# Patient Record
Sex: Male | Born: 1960 | Race: White | Hispanic: No | Marital: Married | Smoking: Never smoker
Health system: Southern US, Community
[De-identification: ages and names within clinical notes are randomized; demographics above are authoritative.]

## PROBLEM LIST (undated history)

## (undated) DIAGNOSIS — I10 Essential (primary) hypertension: Secondary | ICD-10-CM

---

## 2018-12-28 ENCOUNTER — Encounter (HOSPITAL_COMMUNITY): Payer: Self-pay | Admitting: Emergency Medicine

## 2018-12-28 ENCOUNTER — Other Ambulatory Visit: Payer: Self-pay

## 2018-12-28 ENCOUNTER — Emergency Department (HOSPITAL_COMMUNITY): Payer: BLUE CROSS/BLUE SHIELD

## 2018-12-28 ENCOUNTER — Emergency Department (HOSPITAL_COMMUNITY)
Admission: EM | Admit: 2018-12-28 | Discharge: 2018-12-28 | Disposition: A | Discharge status: Home / Self Care | Payer: BLUE CROSS/BLUE SHIELD | Attending: Emergency Medicine | Admitting: Emergency Medicine

## 2018-12-28 DIAGNOSIS — Y929 Unspecified place or not applicable: Secondary | ICD-10-CM | POA: Insufficient documentation

## 2018-12-28 DIAGNOSIS — Y999 Unspecified external cause status: Secondary | ICD-10-CM | POA: Diagnosis not present

## 2018-12-28 DIAGNOSIS — W19XXXA Unspecified fall, initial encounter: Secondary | ICD-10-CM | POA: Insufficient documentation

## 2018-12-28 DIAGNOSIS — I10 Essential (primary) hypertension: Secondary | ICD-10-CM | POA: Diagnosis not present

## 2018-12-28 DIAGNOSIS — S060X1A Concussion with loss of consciousness of 30 minutes or less, initial encounter: Secondary | ICD-10-CM

## 2018-12-28 DIAGNOSIS — S161XXA Strain of muscle, fascia and tendon at neck level, initial encounter: Secondary | ICD-10-CM | POA: Diagnosis not present

## 2018-12-28 DIAGNOSIS — Z79899 Other long term (current) drug therapy: Secondary | ICD-10-CM | POA: Diagnosis not present

## 2018-12-28 DIAGNOSIS — Y939 Activity, unspecified: Secondary | ICD-10-CM | POA: Diagnosis not present

## 2018-12-28 DIAGNOSIS — S0990XA Unspecified injury of head, initial encounter: Secondary | ICD-10-CM | POA: Insufficient documentation

## 2018-12-28 DIAGNOSIS — R55 Syncope and collapse: Secondary | ICD-10-CM | POA: Diagnosis present

## 2018-12-28 HISTORY — DX: Essential (primary) hypertension: I10

## 2018-12-28 LAB — CBC WITH DIFFERENTIAL/PLATELET
Abs Immature Granulocytes: 0.01 10*3/uL (ref 0.00–0.07)
BASOS PCT: 0 %
Basophils Absolute: 0 10*3/uL (ref 0.0–0.1)
EOS ABS: 0.1 10*3/uL (ref 0.0–0.5)
Eosinophils Relative: 1 %
HCT: 44.3 % (ref 39.0–52.0)
Hemoglobin: 14.9 g/dL (ref 13.0–17.0)
Immature Granulocytes: 0 %
Lymphocytes Relative: 32 %
Lymphs Abs: 2.1 10*3/uL (ref 0.7–4.0)
MCH: 29.6 pg (ref 26.0–34.0)
MCHC: 33.6 g/dL (ref 30.0–36.0)
MCV: 87.9 fL (ref 80.0–100.0)
Monocytes Absolute: 0.5 10*3/uL (ref 0.1–1.0)
Monocytes Relative: 7 %
NEUTROS PCT: 60 %
Neutro Abs: 3.8 10*3/uL (ref 1.7–7.7)
Platelets: 206 10*3/uL (ref 150–400)
RBC: 5.04 MIL/uL (ref 4.22–5.81)
RDW: 12.2 % (ref 11.5–15.5)
WBC: 6.4 10*3/uL (ref 4.0–10.5)
nRBC: 0 % (ref 0.0–0.2)

## 2018-12-28 LAB — COMPREHENSIVE METABOLIC PANEL
ALT: 20 U/L (ref 0–44)
ANION GAP: 8 (ref 5–15)
AST: 24 U/L (ref 15–41)
Albumin: 4.2 g/dL (ref 3.5–5.0)
Alkaline Phosphatase: 40 U/L (ref 38–126)
BUN: 15 mg/dL (ref 6–20)
CO2: 25 mmol/L (ref 22–32)
Calcium: 9.2 mg/dL (ref 8.9–10.3)
Chloride: 105 mmol/L (ref 98–111)
Creatinine, Ser: 1.23 mg/dL (ref 0.61–1.24)
GFR calc Af Amer: >60 mL/min (ref >60)
GFR calc non Af Amer: >60 mL/min (ref >60)
Glucose, Bld: 99 mg/dL (ref 70–99)
Potassium: 4.4 mmol/L (ref 3.5–5.1)
Sodium: 138 mmol/L (ref 135–145)
TOTAL PROTEIN: 6.6 g/dL (ref 6.5–8.1)
Total Bilirubin: 0.6 mg/dL (ref 0.3–1.2)

## 2018-12-28 LAB — I-STAT CHEM 8, ED
BUN: 17 mg/dL (ref 6–20)
Calcium, Ion: 1.05 mmol/L — ABNORMAL LOW (ref 1.15–1.40)
Chloride: 105 mmol/L (ref 98–111)
Creatinine, Ser: 1.1 mg/dL (ref 0.61–1.24)
Glucose, Bld: 94 mg/dL (ref 70–99)
HCT: 43 % (ref 39.0–52.0)
HEMOGLOBIN: 14.6 g/dL (ref 13.0–17.0)
Potassium: 4.3 mmol/L (ref 3.5–5.1)
Sodium: 137 mmol/L (ref 135–145)
TCO2: 25 mmol/L (ref 22–32)

## 2018-12-28 MED ORDER — OXYCODONE-ACETAMINOPHEN 5-325 MG PO TABS
1.0000 | ORAL_TABLET | Freq: Once | ORAL | Status: AC
  Administered 2018-12-28: 1 via ORAL
  Filled 2018-12-28: qty 1

## 2018-12-28 MED ORDER — FENTANYL CITRATE (PF) 100 MCG/2ML IJ SOLN
50.0000 ug | Freq: Once | INTRAMUSCULAR | Status: AC
  Administered 2018-12-28: 50 ug via INTRAVENOUS
  Filled 2018-12-28: qty 2

## 2018-12-28 MED ORDER — HYDROCODONE-ACETAMINOPHEN 5-325 MG PO TABS: 1.0000 | ORAL_TABLET | ORAL | 0 refills | Status: AC | PRN

## 2018-12-28 MED ORDER — ONDANSETRON HCL 4 MG/2ML IJ SOLN
4.0000 mg | Freq: Once | INTRAMUSCULAR | Status: AC
  Administered 2018-12-28: 4 mg via INTRAVENOUS
  Filled 2018-12-28: qty 2

## 2018-12-28 MED ORDER — KETOROLAC TROMETHAMINE 30 MG/ML IJ SOLN
30.0000 mg | Freq: Once | INTRAMUSCULAR | Status: AC
  Administered 2018-12-28: 30 mg via INTRAVENOUS
  Filled 2018-12-28: qty 1

## 2018-12-28 NOTE — ED Triage Notes (Signed)
Pt here for positive LOC following 12 foot fall off latter. Pt hit back of head. No other injuries. Vitals stable per EMS

## 2018-12-28 NOTE — ED Notes (Signed)
Patient verbalizes understanding of discharge instructions. Opportunity for questioning and answers were provided. Armband removed by staff, pt discharged from ED.  

## 2018-12-28 NOTE — ED Provider Notes (Signed)
[facility] Provider Note   CSN: [id number] Arrival date & time: 12/28/18  1548     History   Chief Complaint Chief Complaint  Patient presents with  . Loss of Consciousness    HPI Brett Burns is a 58 y.o. male.  Patient is a 58 year old male who presents after a fall.  He was on a ladder and fell backward 12 feet on the ground.  He fell backward and hit his head.  He complains of pain in the back of his head and his upper neck area.  He denies any numbness or weakness to his extremities.  He did have a positive loss of consciousness.  He has had some nausea per EMS but no vomiting.  He denies any other injuries from the fall.  He is not on anticoagulants.     Past Medical History:  Diagnosis Date  . Hypertension     There are no active problems to display for this patient.   History reviewed. No pertinent surgical history.      Home Medications    Prior to Admission medications   Medication Sig Start Date End Date Taking? Authorizing Provider  Armodafinil 250 MG tablet Take 250 mg by mouth as needed (narcolepsy, sleep apnea).    Yes [provider]  lisinopril (PRINIVIL,ZESTRIL) 20 MG tablet Take 20 mg by mouth daily.   Yes [provider]  rosuvastatin (CRESTOR) 20 MG tablet Take 20 mg by mouth every evening.   Yes [provider]  sertraline (ZOLOFT) 100 MG tablet Take 100 mg by mouth daily.   Yes [provider]  HYDROcodone-acetaminophen (NORCO/VICODIN) 5-325 MG tablet Take 1-2 tablets by mouth every 4 (four) hours as needed. 12/28/18   Rolan BuccoBelfi, Pocahontas Cohenour, MD    Family History No family history on file.  Social History Social History   Tobacco Use  . Smoking status: Never Smoker  Substance Use Topics  . Alcohol use: Not Currently  . Drug use: Not Currently     Allergies   Erythromycin   Review of Systems Review of Systems  Constitutional: Negative for activity change,  appetite change and fever.  HENT: Negative for dental problem, nosebleeds and trouble swallowing.   Eyes: Negative for pain and visual disturbance.  Respiratory: Negative for shortness of breath.   Cardiovascular: Negative for chest pain.  Gastrointestinal: Positive for nausea. Negative for abdominal pain and vomiting.  Genitourinary: Negative for dysuria and hematuria.  Musculoskeletal: Positive for neck pain. Negative for arthralgias, back pain and joint swelling.  Skin: Negative for wound.  Neurological: Positive for headaches. Negative for weakness and numbness.  Psychiatric/Behavioral: Negative for confusion.     Physical Exam Updated Vital Signs BP 128/80   Pulse 64   Temp 97.8 F (36.6 C) (Oral)   Resp 12   SpO2 94%   Physical Exam Vitals signs reviewed.  Constitutional:      Appearance: He is well-developed.  HENT:     Head: Normocephalic and atraumatic.     Comments: Patient has tenderness to the posterior scalp area    Nose: Nose normal.  Eyes:     Conjunctiva/sclera: Conjunctivae normal.     Pupils: Pupils are equal, round, and reactive to light.  Neck:     Comments: Positive tenderness to the mid and upper cervical spine, no step-offs or deformities.  No pain to the thoracic, or LS spine.  No step-offs or deformities noted Cardiovascular:     Rate and Rhythm:  Normal rate and regular rhythm.     Heart sounds: No murmur.     Comments: No evidence of external trauma to the chest or abdomen Pulmonary:     Effort: Pulmonary effort is normal. No respiratory distress.     Breath sounds: Normal breath sounds. No wheezing.  Chest:     Chest wall: No tenderness.  Abdominal:     General: Bowel sounds are normal. There is no distension.     Palpations: Abdomen is soft.     Tenderness: There is no abdominal tenderness.  Musculoskeletal: Normal range of motion.     Comments: No pain on palpation or ROM of the extremities  Skin:    General: Skin is warm and dry.      Capillary Refill: Capillary refill takes less than 2 seconds.  Neurological:     Mental Status: He is alert and oriented to person, place, and time.     Sensory: No sensory deficit.     Motor: No weakness.     Comments: Motor 5 out of 5 all extremities, sensation grossly intact light touch all extremities      ED Treatments / Results  Labs (all labs ordered are listed, but only abnormal results are displayed) Labs Reviewed  I-STAT CHEM 8, ED - Abnormal; Notable for the following components:      Result Value   Calcium, Ion 1.05 (*)    All other components within normal limits  COMPREHENSIVE METABOLIC PANEL  CBC WITH DIFFERENTIAL/PLATELET    EKG EKG Interpretation  Date/Time:  Thursday December 28 2018 16:52:16 EST Ventricular Rate:  63 PR Interval:    QRS Duration: 102 QT Interval:  420 QTC Calculation: 430 R Axis:   65 Text Interpretation:  Sinus rhythm Abnormal inferior Q waves No old tracing to compare Confirmed by Rolan Bucco [phone]) on 12/28/2018 6:08:25 PM   Radiology Ct Head Wo Contrast  Result Date: 12/28/2018 CLINICAL DATA:  Patient fell from 12 feet. EXAM: CT HEAD WITHOUT CONTRAST CT CERVICAL SPINE WITHOUT CONTRAST TECHNIQUE: Multidetector CT imaging of the head and cervical spine was performed following the standard protocol without intravenous contrast. Multiplanar CT image reconstructions of the cervical spine were also generated. COMPARISON:  None. FINDINGS: CT HEAD FINDINGS Brain: No evidence of acute infarction, hemorrhage, hydrocephalus, extra-axial collection or mass lesion/mass effect. Normal cerebral volume. No white matter disease. Vascular: No hyperdense vessel or unexpected calcification. Slight prominence of the anterior cerebral arteries in the interhemispheric fissure simulates blood. Skull: Normal. Negative for fracture or focal lesion. Sinuses/Orbits: Air-fluid level in the LEFT maxillary sinus without visible fracture, may be inflammatory in origin.  No visible facial fracture. No TMJ dislocation. Negative orbits. Other: None. CT CERVICAL SPINE FINDINGS Alignment: Normal. Skull base and vertebrae: No acute fracture. No primary bone lesion or focal pathologic process. Soft tissues and spinal canal: No prevertebral fluid or swelling. No visible canal hematoma. Disc levels: Unremarkable. Previous C5-C6 fusion. Disc space narrowing C6-C7. Facet arthropathy C7-T1 and T1-T2. Upper chest: No upper rib fracture or pneumothorax. Other: None IMPRESSION: 1. No skull fracture or intracranial hemorrhage. 2. No cervical spine fracture or traumatic subluxation. 3. These results were called by telephone at the time of interpretation on 12/28/2018 at 4:10pm to Dr. Shawna Orleans Shondale Quinley , who verbally acknowledged these results. Electronically Signed   By: Elsie Stain M.D.   On: 12/28/2018 16:20   Ct Cervical Spine Wo Contrast  Result Date: 12/28/2018 CLINICAL DATA:  Patient fell from 12 feet. EXAM: CT  HEAD WITHOUT CONTRAST CT CERVICAL SPINE WITHOUT CONTRAST TECHNIQUE: Multidetector CT imaging of the head and cervical spine was performed following the standard protocol without intravenous contrast. Multiplanar CT image reconstructions of the cervical spine were also generated. COMPARISON:  None. FINDINGS: CT HEAD FINDINGS Brain: No evidence of acute infarction, hemorrhage, hydrocephalus, extra-axial collection or mass lesion/mass effect. Normal cerebral volume. No white matter disease. Vascular: No hyperdense vessel or unexpected calcification. Slight prominence of the anterior cerebral arteries in the interhemispheric fissure simulates blood. Skull: Normal. Negative for fracture or focal lesion. Sinuses/Orbits: Air-fluid level in the LEFT maxillary sinus without visible fracture, may be inflammatory in origin. No visible facial fracture. No TMJ dislocation. Negative orbits. Other: None. CT CERVICAL SPINE FINDINGS Alignment: Normal. Skull base and vertebrae: No acute fracture. No  primary bone lesion or focal pathologic process. Soft tissues and spinal canal: No prevertebral fluid or swelling. No visible canal hematoma. Disc levels: Unremarkable. Previous C5-C6 fusion. Disc space narrowing C6-C7. Facet arthropathy C7-T1 and T1-T2. Upper chest: No upper rib fracture or pneumothorax. Other: None IMPRESSION: 1. No skull fracture or intracranial hemorrhage. 2. No cervical spine fracture or traumatic subluxation. 3. These results were called by telephone at the time of interpretation on 12/28/2018 at 4:10pm to Dr. Shawna Orleans Kalil Woessner , who verbally acknowledged these results. Electronically Signed   By: Elsie Stain M.D.   On: 12/28/2018 16:20   Dg Chest Port 1 View  Result Date: 12/28/2018 CLINICAL DATA:  Larey Seat 12 feet off a ladder. Hit back of head. Positive loss of consciousness. EXAM: PORTABLE CHEST 1 VIEW COMPARISON:  None. FINDINGS: Cardiac silhouette is normal size. No mediastinal widening. No evidence of a mediastinal or hilar mass. Clear lungs. No gross pneumothorax or pleural effusion on this supine exam. Skeletal structures are intact. IMPRESSION: No active disease. Electronically Signed   By: Amie Portland M.D.   On: 12/28/2018 16:44    Procedures Procedures (including critical care time)  Medications Ordered in ED Medications  oxyCODONE-acetaminophen (PERCOCET/ROXICET) 5-325 MG per tablet 1 tablet (has no administration in time range)  ketorolac (TORADOL) 30 MG/ML injection 30 mg (has no administration in time range)  fentaNYL (SUBLIMAZE) injection 50 mcg (50 mcg Intravenous Given 12/28/18 1701)  ondansetron (ZOFRAN) injection 4 mg (4 mg Intravenous Given 12/28/18 1701)     Initial Impression / Assessment and Plan / ED Course  I have reviewed the triage vital signs and the nursing notes.  Pertinent labs & imaging results that were available during my care of the patient were reviewed by me and considered in my medical decision making (see chart for details).     Patient  is a 58 year old male who presents after a fall.  He had a positive loss of consciousness.  He complains of a headache and neck pain.  No other injuries are identified.  He had a CT scan of his head and cervical spine which showed no acute injuries.  He has no neurologic deficits.  He is feeling better after treatment in the ED although he still has a headache.  He has had no vomiting.  He is able to ambulate without ataxia.  He was discharged home in good condition.  He was given a prescription for a few hydrocodone tablets.  He was advised that he can also use ibuprofen for symptomatic relief.  He was advised to have close follow-up with his PCP.  Return precautions were given.  Final Clinical Impressions(s) / ED Diagnoses   Final diagnoses:  Concussion  with loss of consciousness of 30 minutes or less, initial encounter  Strain of neck muscle, initial encounter    ED Discharge Orders         Ordered    HYDROcodone-acetaminophen (NORCO/VICODIN) 5-325 MG tablet  Every 4 hours PRN     12/28/18 1909           Rolan BuccoBelfi, Kristol Almanzar, MD 12/28/18 1911

## 2019-05-18 ENCOUNTER — Other Ambulatory Visit: Payer: Self-pay

## 2019-05-18 ENCOUNTER — Ambulatory Visit (HOSPITAL_COMMUNITY)
Admission: EM | Admit: 2019-05-18 | Discharge: 2019-05-19 | Disposition: A | Discharge status: Home / Self Care | Payer: BLUE CROSS/BLUE SHIELD | Attending: General Surgery | Admitting: General Surgery

## 2019-05-18 ENCOUNTER — Emergency Department (HOSPITAL_COMMUNITY): Payer: BLUE CROSS/BLUE SHIELD | Admitting: Anesthesiology

## 2019-05-18 ENCOUNTER — Encounter (HOSPITAL_COMMUNITY): Payer: Self-pay | Admitting: Emergency Medicine

## 2019-05-18 ENCOUNTER — Emergency Department (HOSPITAL_COMMUNITY): Payer: BLUE CROSS/BLUE SHIELD

## 2019-05-18 ENCOUNTER — Encounter (HOSPITAL_COMMUNITY)
Admission: EM | Disposition: A | Discharge status: Home / Self Care | Payer: Self-pay | Source: Home / Self Care | Attending: Emergency Medicine

## 2019-05-18 DIAGNOSIS — G4730 Sleep apnea, unspecified: Secondary | ICD-10-CM | POA: Diagnosis not present

## 2019-05-18 DIAGNOSIS — Z79899 Other long term (current) drug therapy: Secondary | ICD-10-CM | POA: Insufficient documentation

## 2019-05-18 DIAGNOSIS — W312XXA Contact with powered woodworking and forming machines, initial encounter: Secondary | ICD-10-CM | POA: Diagnosis not present

## 2019-05-18 DIAGNOSIS — S62633B Displaced fracture of distal phalanx of left middle finger, initial encounter for open fracture: Secondary | ICD-10-CM | POA: Insufficient documentation

## 2019-05-18 DIAGNOSIS — Z881 Allergy status to other antibiotic agents status: Secondary | ICD-10-CM | POA: Diagnosis not present

## 2019-05-18 DIAGNOSIS — S66323A Laceration of extensor muscle, fascia and tendon of left middle finger at wrist and hand level, initial encounter: Secondary | ICD-10-CM | POA: Diagnosis not present

## 2019-05-18 DIAGNOSIS — Z1159 Encounter for screening for other viral diseases: Secondary | ICD-10-CM | POA: Diagnosis not present

## 2019-05-18 DIAGNOSIS — I10 Essential (primary) hypertension: Secondary | ICD-10-CM | POA: Insufficient documentation

## 2019-05-18 DIAGNOSIS — S62635B Displaced fracture of distal phalanx of left ring finger, initial encounter for open fracture: Secondary | ICD-10-CM | POA: Insufficient documentation

## 2019-05-18 DIAGNOSIS — S63293A Dislocation of distal interphalangeal joint of left middle finger, initial encounter: Secondary | ICD-10-CM | POA: Diagnosis not present

## 2019-05-18 HISTORY — PX: I & D EXTREMITY: SHX5045

## 2019-05-18 LAB — SARS CORONAVIRUS 2 BY RT PCR (HOSPITAL ORDER, PERFORMED IN ~~LOC~~ HOSPITAL LAB): SARS Coronavirus 2: NEGATIVE

## 2019-05-18 SURGERY — IRRIGATION AND DEBRIDEMENT EXTREMITY
Anesthesia: General | Laterality: Left

## 2019-05-18 MED ORDER — ROCURONIUM BROMIDE 10 MG/ML (PF) SYRINGE
PREFILLED_SYRINGE | INTRAVENOUS | Status: AC
  Filled 2019-05-18: qty 10

## 2019-05-18 MED ORDER — OXYCODONE HCL 5 MG/5ML PO SOLN: 5.0000 mg | Freq: Once | ORAL | Status: AC | PRN

## 2019-05-18 MED ORDER — MIDAZOLAM HCL 5 MG/5ML IJ SOLN
INTRAMUSCULAR | Status: DC | PRN
  Administered 2019-05-18: 2 mg via INTRAVENOUS

## 2019-05-18 MED ORDER — GLYCOPYRROLATE 0.2 MG/ML IJ SOLN
INTRAMUSCULAR | Status: DC | PRN
  Administered 2019-05-18: .2 mg via INTRAVENOUS

## 2019-05-18 MED ORDER — FENTANYL CITRATE (PF) 250 MCG/5ML IJ SOLN
INTRAMUSCULAR | Status: AC
  Filled 2019-05-18: qty 5

## 2019-05-18 MED ORDER — OXYCODONE HCL 5 MG PO TABS
5.0000 mg | ORAL_TABLET | Freq: Once | ORAL | Status: AC | PRN
  Administered 2019-05-18: 5 mg via ORAL

## 2019-05-18 MED ORDER — SUCCINYLCHOLINE CHLORIDE 200 MG/10ML IV SOSY
PREFILLED_SYRINGE | INTRAVENOUS | Status: AC
  Filled 2019-05-18: qty 20

## 2019-05-18 MED ORDER — FENTANYL CITRATE (PF) 250 MCG/5ML IJ SOLN
INTRAMUSCULAR | Status: DC | PRN
  Administered 2019-05-18: 50 ug via INTRAVENOUS
  Administered 2019-05-18: 100 ug via INTRAVENOUS
  Administered 2019-05-18 (×2): 50 ug via INTRAVENOUS

## 2019-05-18 MED ORDER — EPHEDRINE SULFATE 50 MG/ML IJ SOLN
INTRAMUSCULAR | Status: DC | PRN
  Administered 2019-05-18 (×2): 5 mg via INTRAVENOUS

## 2019-05-18 MED ORDER — OXYCODONE HCL 5 MG PO TABS
ORAL_TABLET | ORAL | Status: AC
  Filled 2019-05-18: qty 1

## 2019-05-18 MED ORDER — SUCCINYLCHOLINE CHLORIDE 200 MG/10ML IV SOSY
PREFILLED_SYRINGE | INTRAVENOUS | Status: DC | PRN
  Administered 2019-05-18: 80 mg via INTRAVENOUS

## 2019-05-18 MED ORDER — HYDROMORPHONE HCL 1 MG/ML IJ SOLN
1.0000 mg | Freq: Once | INTRAMUSCULAR | Status: AC
  Administered 2019-05-18: 1 mg via INTRAVENOUS
  Filled 2019-05-18: qty 1

## 2019-05-18 MED ORDER — LACTATED RINGERS IV SOLN
INTRAVENOUS | Status: DC | PRN
  Administered 2019-05-18: 22:00:00 via INTRAVENOUS

## 2019-05-18 MED ORDER — BUPIVACAINE HCL (PF) 0.25 % IJ SOLN
INTRAMUSCULAR | Status: AC
  Filled 2019-05-18: qty 30

## 2019-05-18 MED ORDER — PROPOFOL 10 MG/ML IV BOLUS
INTRAVENOUS | Status: AC
  Filled 2019-05-18: qty 40

## 2019-05-18 MED ORDER — 0.9 % SODIUM CHLORIDE (POUR BTL) OPTIME
TOPICAL | Status: DC | PRN
  Administered 2019-05-18: 1000 mL

## 2019-05-18 MED ORDER — FENTANYL CITRATE (PF) 100 MCG/2ML IJ SOLN
INTRAMUSCULAR | Status: AC
  Filled 2019-05-18: qty 2

## 2019-05-18 MED ORDER — ACETAMINOPHEN 10 MG/ML IV SOLN: 1000.0000 mg | Freq: Once | INTRAVENOUS | Status: DC | PRN

## 2019-05-18 MED ORDER — ACETAMINOPHEN 160 MG/5ML PO SOLN: 1000.0000 mg | Freq: Once | ORAL | Status: DC | PRN

## 2019-05-18 MED ORDER — CEFAZOLIN SODIUM-DEXTROSE 2-3 GM-%(50ML) IV SOLR
INTRAVENOUS | Status: DC | PRN
  Administered 2019-05-18: 2 g via INTRAVENOUS

## 2019-05-18 MED ORDER — ACETAMINOPHEN 500 MG PO TABS: 1000.0000 mg | ORAL_TABLET | Freq: Once | ORAL | Status: DC | PRN

## 2019-05-18 MED ORDER — BUPIVACAINE HCL (PF) 0.25 % IJ SOLN
INTRAMUSCULAR | Status: DC | PRN
  Administered 2019-05-18: 10 mL

## 2019-05-18 MED ORDER — EPHEDRINE 5 MG/ML INJ
INTRAVENOUS | Status: AC
  Filled 2019-05-18: qty 30

## 2019-05-18 MED ORDER — CEPHALEXIN 500 MG PO CAPS: 500.0000 mg | ORAL_CAPSULE | Freq: Four times a day (QID) | ORAL | 0 refills | Status: AC

## 2019-05-18 MED ORDER — GLYCOPYRROLATE PF 0.2 MG/ML IJ SOSY
PREFILLED_SYRINGE | INTRAMUSCULAR | Status: AC
  Filled 2019-05-18: qty 1

## 2019-05-18 MED ORDER — MIDAZOLAM HCL 2 MG/2ML IJ SOLN
INTRAMUSCULAR | Status: AC
  Filled 2019-05-18: qty 2

## 2019-05-18 MED ORDER — ONDANSETRON HCL 4 MG/2ML IJ SOLN
INTRAMUSCULAR | Status: AC
  Filled 2019-05-18: qty 2

## 2019-05-18 MED ORDER — HYDROCODONE-ACETAMINOPHEN 5-325 MG PO TABS: 1.0000 | ORAL_TABLET | ORAL | Status: DC | PRN

## 2019-05-18 MED ORDER — PROPOFOL 10 MG/ML IV BOLUS
INTRAVENOUS | Status: DC | PRN
  Administered 2019-05-18: 180 mg via INTRAVENOUS

## 2019-05-18 MED ORDER — FENTANYL CITRATE (PF) 100 MCG/2ML IJ SOLN
25.0000 ug | INTRAMUSCULAR | Status: DC | PRN
  Administered 2019-05-18: 25 ug via INTRAVENOUS
  Administered 2019-05-18: 50 ug via INTRAVENOUS

## 2019-05-18 MED ORDER — ONDANSETRON HCL 4 MG/2ML IJ SOLN
INTRAMUSCULAR | Status: DC | PRN
  Administered 2019-05-18: 4 mg via INTRAVENOUS

## 2019-05-18 MED ORDER — MORPHINE SULFATE (PF) 4 MG/ML IV SOLN
4.0000 mg | Freq: Once | INTRAVENOUS | Status: AC
  Administered 2019-05-18: 4 mg via INTRAVENOUS
  Filled 2019-05-18: qty 1

## 2019-05-18 SURGICAL SUPPLY — 45 items
BAG DECANTER FOR FLEXI CONT (MISCELLANEOUS) IMPLANT
BANDAGE ACE 3X5.8 VEL STRL LF (GAUZE/BANDAGES/DRESSINGS) IMPLANT
BANDAGE ACE 4X5 VEL STRL LF (GAUZE/BANDAGES/DRESSINGS) IMPLANT
BNDG COHESIVE 1X5 TAN STRL LF (GAUZE/BANDAGES/DRESSINGS) ×3 IMPLANT
BNDG CONFORM 2 STRL LF (GAUZE/BANDAGES/DRESSINGS) ×3 IMPLANT
BNDG GAUZE ELAST 4 BULKY (GAUZE/BANDAGES/DRESSINGS) IMPLANT
CORDS BIPOLAR (ELECTRODE) IMPLANT
COVER WAND RF STERILE (DRAPES) IMPLANT
CUFF TOURNIQUET SINGLE 18IN (TOURNIQUET CUFF) IMPLANT
DRAPE SURG 17X23 STRL (DRAPES) ×3 IMPLANT
DRSG XEROFORM 1X8 (GAUZE/BANDAGES/DRESSINGS) IMPLANT
ELECT REM PT RETURN 9FT ADLT (ELECTROSURGICAL)
ELECTRODE REM PT RTRN 9FT ADLT (ELECTROSURGICAL) IMPLANT
FLUID NSS /IRRIG 3000 ML XXX (IV SOLUTION) IMPLANT
GAUZE PACKING IODOFORM 1/4X5 (PACKING) IMPLANT
GAUZE SPONGE 4X4 12PLY STRL (GAUZE/BANDAGES/DRESSINGS) ×3 IMPLANT
GAUZE XEROFORM 1X8 LF (GAUZE/BANDAGES/DRESSINGS) ×3 IMPLANT
GLOVE BIOGEL M 8.0 STRL (GLOVE) ×3 IMPLANT
GOWN STRL REUS W/ TWL LRG LVL3 (GOWN DISPOSABLE) ×2 IMPLANT
GOWN STRL REUS W/TWL LRG LVL3 (GOWN DISPOSABLE) ×4
HANDPIECE INTERPULSE COAX TIP (DISPOSABLE)
KIT BASIN OR (CUSTOM PROCEDURE TRAY) ×3 IMPLANT
KIT TURNOVER KIT B (KITS) ×3 IMPLANT
MANIFOLD NEPTUNE II (INSTRUMENTS) IMPLANT
NEEDLE HYPO 25GX1X1/2 BEV (NEEDLE) IMPLANT
NS IRRIG 1000ML POUR BTL (IV SOLUTION) ×3 IMPLANT
PACK ORTHO EXTREMITY (CUSTOM PROCEDURE TRAY) ×3 IMPLANT
PAD ARMBOARD 7.5X6 YLW CONV (MISCELLANEOUS) IMPLANT
PAD CAST 4YDX4 CTTN HI CHSV (CAST SUPPLIES) IMPLANT
PADDING CAST COTTON 4X4 STRL (CAST SUPPLIES)
SET CYSTO W/LG BORE CLAMP LF (SET/KITS/TRAYS/PACK) IMPLANT
SET HNDPC FAN SPRY TIP SCT (DISPOSABLE) IMPLANT
SOAP 2 % CHG 4 OZ (WOUND CARE) ×3 IMPLANT
SPONGE LAP 18X18 RF (DISPOSABLE) IMPLANT
SPONGE LAP 4X18 RFD (DISPOSABLE) IMPLANT
SUT CHROMIC 6 0 PS 4 (SUTURE) ×3 IMPLANT
SUT ETHILON 5 0 PS 2 18 (SUTURE) ×3 IMPLANT
SWAB CULTURE ESWAB REG 1ML (MISCELLANEOUS) IMPLANT
SYR CONTROL 10ML LL (SYRINGE) IMPLANT
TOWEL OR 17X24 6PK STRL BLUE (TOWEL DISPOSABLE) IMPLANT
TOWEL OR 17X26 10 PK STRL BLUE (TOWEL DISPOSABLE) ×3 IMPLANT
TUBE CONNECTING 12'X1/4 (SUCTIONS) ×1
TUBE CONNECTING 12X1/4 (SUCTIONS) ×2 IMPLANT
WATER STERILE IRR 1000ML POUR (IV SOLUTION) ×3 IMPLANT
YANKAUER SUCT BULB TIP NO VENT (SUCTIONS) ×3 IMPLANT

## 2019-05-18 NOTE — Anesthesia Preprocedure Evaluation (Addendum)
Anesthesia Evaluation  Patient identified by MRN, date of birth, ID band Patient awake    Reviewed: Allergy & Precautions, NPO status , Patient's Chart, lab work & pertinent test results  History of Anesthesia Complications Negative for: history of anesthetic complications  Airway Mallampati: II  TM Distance: >3 FB Neck ROM: Full    Dental  (+) Teeth Intact   Pulmonary sleep apnea ,  S/p hypoglossal nerve stim   breath sounds clear to auscultation       Cardiovascular hypertension, Pt. on medications  Rhythm:Regular     Neuro/Psych negative neurological ROS  negative psych ROS   GI/Hepatic negative GI ROS, Neg liver ROS,   Endo/Other  negative endocrine ROS  Renal/GU negative Renal ROS     Musculoskeletal COMPLEX LACERATION  3RD AND 4TH DIGIT   Abdominal   Peds  Hematology negative hematology ROS (+)   Anesthesia Other Findings   Reproductive/Obstetrics                            Anesthesia Physical Anesthesia Plan  ASA: II  Anesthesia Plan: General   Post-op Pain Management:    Induction: Intravenous  PONV Risk Score and Plan: 2 and Ondansetron and Dexamethasone  Airway Management Planned: LMA and Oral ETT  Additional Equipment: None  Intra-op Plan:   Post-operative Plan: Extubation in OR  Informed Consent: I have reviewed the patients History and Physical, chart, labs and discussed the procedure including the risks, benefits and alternatives for the proposed anesthesia with the patient or authorized representative who has indicated his/her understanding and acceptance.     Dental advisory given  Plan Discussed with: CRNA and Surgeon  Anesthesia Plan Comments:         Anesthesia Quick Evaluation

## 2019-05-18 NOTE — Consult Note (Signed)
Reason for Consult:finger injury Referring Physician: ER  CC:I cut my fingers  HPI:  Brett Burns is an 58 y.o.  male who presents with  Complex lacerations, open fractures of left 3,4 digits.  Pt was using a table saw this afternoon and his fingers slipped into the blade .   Pain is rated at   9 /10 and is described as sharp.  Pain is constant.  Pain is made better by rest/immobilization, worse with motion.   Associated signs/symptoms: no other injuries Previous treatment:    Past Medical History:  Diagnosis Date  . Hypertension     History reviewed. No pertinent surgical history.  No family history on file.  Social History:  reports that he has never smoked. He does not have any smokeless tobacco history on file. He reports previous alcohol use. He reports previous drug use.  Allergies:  Allergies  Allergen Reactions  . Erythromycin Other (See Comments)    Itching    Medications: I have reviewed the patient's current medications.  Results for orders placed or performed during the hospital encounter of 05/18/19 (from the past 48 hour(s))  SARS Coronavirus 2 (CEPHEID - Performed in [facility] hospital lab), Hosp Order     Status: None   Collection Time: 05/18/19  7:32 PM  Result Value Ref Range   SARS Coronavirus 2 NEGATIVE NEGATIVE    Comment: (NOTE) If result is NEGATIVE SARS-CoV-2 target nucleic acids are NOT DETECTED. The SARS-CoV-2 RNA is generally detectable in upper and lower  respiratory specimens during the acute phase of infection. The lowest  concentration of SARS-CoV-2 viral copies this assay can detect is 250  copies / mL. A negative result does not preclude SARS-CoV-2 infection  and should not be used as the sole basis for treatment or other  patient management decisions.  A negative result may occur with  improper specimen collection / handling, submission of specimen other  than nasopharyngeal swab, presence of viral mutation(s) within the  areas  targeted by this assay, and inadequate number of viral copies  (<250 copies / mL). A negative result must be combined with clinical  observations, patient history, and epidemiological information. If result is POSITIVE SARS-CoV-2 target nucleic acids are DETECTED. The SARS-CoV-2 RNA is generally detectable in upper and lower  respiratory specimens dur ing the acute phase of infection.  Positive  results are indicative of active infection with SARS-CoV-2.  Clinical  correlation with patient history and other diagnostic information is  necessary to determine patient infection status.  Positive results do  not rule out bacterial infection or co-infection with other viruses. If result is PRESUMPTIVE POSTIVE SARS-CoV-2 nucleic acids MAY BE PRESENT.   A presumptive positive result was obtained on the submitted specimen  and confirmed on repeat testing.  While 2019 novel coronavirus  (SARS-CoV-2) nucleic acids may be present in the submitted sample  additional confirmatory testing may be necessary for epidemiological  and / or clinical management purposes  to differentiate between  SARS-CoV-2 and other Sarbecovirus currently known to infect humans.  If clinically indicated additional testing with an alternate test  methodology [phone]([id number]) is advised. The SARS-CoV-2 RNA is generally  detectable in upper and lower respiratory sp ecimens during the acute  phase of infection. The expected result is Negative. Fact Sheet for Patients:  BoilerBrush.com.cyhttps://www.fda.gov/media/136312/download Fact Sheet for Healthcare Providers: https://pope.com/https://www.fda.gov/media/136313/download This test is not yet approved or cleared by the [place] FDA and has been authorized for detection and/or diagnosis of SARS-CoV-2 by  FDA under an Emergency Use Authorization (EUA).  This EUA will remain in effect (meaning this test can be used) for the duration of the COVID-19 declaration under Section 564(b)(1) of the Act, 21 U.S.C. section  360bbb-3(b)(1), unless the authorization is terminated or revoked sooner. Performed at [facility], [address] [place], [place] [address]     Dg Hand Complete Left  Result Date: 05/18/2019 CLINICAL DATA:  Injury with table saw EXAM: LEFT HAND - COMPLETE 3+ VIEW COMPARISON:  None. FINDINGS: Frontal, oblique, and lateral views were obtained. There is soft tissue injury involving the distal third and fourth digits. There is a comminuted fracture of the distal aspect of the fourth distal phalanx with several displaced fracture fragments. There is dislocation at the third DIP joint with volar displacement of the distal third phalanx with respect to the third middle phalanx. There is a fracture along the dorsal proximal aspect of the third distal phalanx with several small fracture fragments in this area. No other fractures or dislocations evident. Joint spaces appear normal. No erosive change. IMPRESSION: 1. Comminuted fracture distal aspect fourth distal phalanx with displaced fracture fragments. 2. Dislocation third DIP joint with fracture along the proximal dorsal aspect of the third distal phalanx. 3. Soft tissue injury to the third and fourth distal digits with overlying bandage. Electronically Signed   By: Bretta Bang III M.D.   On: 05/18/2019 18:20    Pertinent items are noted in HPI. Temp:  [98.3 F (36.8 C)] 98.3 F (36.8 C) (05/22 1630) Pulse Rate:  [54-74] 65 (05/22 1930) Resp:  [11-16] 12 (05/22 1700) BP: (114-137)/(69-92) 129/86 (05/22 1930) SpO2:  [94 %-98 %] 95 % (05/22 1930) Weight:  [86.2 kg] 86.2 kg (05/22 1630) General appearance: alert and cooperative Resp: clear to auscultation bilaterally Cardio: regular rate and rhythm GI: soft, non-tender; bowel sounds normal; no masses,  no organomegaly Extremities: extremities normal, atraumatic, no cyanosis or edema Except for left 3,4 digits; LLF near amputation thru distal middle phalanx finger tip pink, altered  sensation; LRF near amputation of distal phalanx thru dipj, finger tip pink   Assessment: 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  The patient and/or caregiver understands that additional treatment may be necessary.  Johnette Abraham 05/18/2019, 9:40 PM

## 2019-05-18 NOTE — Anesthesia Procedure Notes (Signed)
Procedure Name: Intubation Date/Time: 05/18/2019 10:05 PM Performed by: Claudina Lick, CRNA Pre-anesthesia Checklist: Patient identified, Emergency Drugs available, Suction available, Patient being monitored and Timeout performed Patient Re-evaluated:Patient Re-evaluated prior to induction Oxygen Delivery Method: Circle system utilized Preoxygenation: Pre-oxygenation with 100% oxygen Induction Type: IV induction and Rapid sequence Ventilation: Mask ventilation without difficulty Laryngoscope Size: Miller and 2 Grade View: Grade I Tube type: Oral Tube size: 7.5 mm Number of attempts: 1 Airway Equipment and Method: Stylet Placement Confirmation: ETT inserted through vocal cords under direct vision,  positive ETCO2 and breath sounds checked- equal and bilateral Secured at: 23 cm Tube secured with: Tape Dental Injury: Teeth and Oropharynx as per pre-operative assessment

## 2019-05-18 NOTE — Transfer of Care (Signed)
Immediate Anesthesia Transfer of Care Note  Patient: Brett Burns  Procedure(s) Performed: REPAIR COMPLES LACERATION 3RD AND 4TH DIGIT (Left )  Patient Location: PACU  Anesthesia Type:General  Level of Consciousness: drowsy  Airway & Oxygen Therapy: Patient Spontanous Breathing and Patient connected to nasal cannula oxygen  Post-op Assessment: Report given to RN and Post -op Vital signs reviewed and stable  Post vital signs: Reviewed and stable  Last Vitals:  Vitals Value Taken Time  BP    Temp    Pulse    Resp    SpO2      Last Pain:  Vitals:   05/18/19 1630  TempSrc: Oral  PainSc:          Complications: No apparent anesthesia complications

## 2019-05-18 NOTE — ED Provider Notes (Signed)
[facility] EMERGENCY DEPARTMENT Provider Note   CSN: [id number] Arrival date & time:       History   Chief Complaint Chief Complaint  Patient presents with   Hand Injury    HPI    Brett Burns is a 58 y.o. male with a PMHx of HTN, who presents to the ED with complaints of left hand injury sustained on a table saw around 4 PM.  States that he was using a table saw when it "kicked" and caused a laceration to his left ring and middle fingers.  He complains of 8/10 constant sharp nonradiating left ring and middle finger pain, worse with movement, and improved with fentanyl 200 mcg given in route.  He states the bleeding has been controlled with slight pressure to the fingers.  He reports loss of range of motion of those fingers primarily due to pain.  His last tetanus shot was 2 years ago.  He does not have a hand specialist that he sees.  He has not eaten anything since noon today.  He denies any numbness, tingling, focal weakness, lightheadedness, or any other complaints or injuries at this time.  No other injury sustained during the incident.  The history is provided by the patient and medical records. No language interpreter was used.  Hand Injury    Past Medical History:  Diagnosis Date   Hypertension     There are no active problems to display for this patient.   History reviewed. No pertinent surgical history.      Home Medications    Prior to Admission medications   Medication Sig Start Date End Date Taking? Authorizing Provider  Armodafinil 250 MG tablet Take 250 mg by mouth as needed (narcolepsy, sleep apnea).     [provider]  HYDROcodone-acetaminophen (NORCO/VICODIN) 5-325 MG tablet Take 1-2 tablets by mouth every 4 (four) hours as needed. 12/28/18   Rolan Bucco, MD  lisinopril (PRINIVIL,ZESTRIL) 20 MG tablet Take 20 mg by mouth daily.    [provider]  rosuvastatin (CRESTOR) 20 MG tablet Take 20 mg by mouth every  evening.    [provider]  sertraline (ZOLOFT) 100 MG tablet Take 100 mg by mouth daily.    [provider]    Family History No family history on file.  Social History Social History   Tobacco Use   Smoking status: Never Smoker  Substance Use Topics   Alcohol use: Not Currently   Drug use: Not Currently     Allergies   Erythromycin   Review of Systems Review of Systems  Musculoskeletal: Positive for arthralgias.  Skin: Positive for wound.  Allergic/Immunologic: Negative for immunocompromised state.  Neurological: Negative for weakness, light-headedness and numbness.     Physical Exam Updated Vital Signs BP 133/78    Pulse (!) 59    Temp 98.3 F (36.8 C) (Oral)    Resp 16    Ht 5\' 8"  (1.727 m)    Wt 86.2 kg    SpO2 97%    BMI 28.89 kg/m   Physical Exam Vitals signs and nursing note reviewed.  Constitutional:      General: He is not in acute distress.    Appearance: Normal appearance. He is well-developed. He is not toxic-appearing.     Comments: Afebrile, nontoxic, NAD  HENT:     Head: Normocephalic and atraumatic.  Eyes:     General:        Right eye: No discharge.  Left eye: No discharge.     Conjunctiva/sclera: Conjunctivae normal.  Neck:     Musculoskeletal: Normal range of motion and neck supple.  Cardiovascular:     Rate and Rhythm: Normal rate.     Pulses: Normal pulses.  Pulmonary:     Effort: Pulmonary effort is normal. No respiratory distress.  Abdominal:     General: There is no distension.  Musculoskeletal:     Left hand: He exhibits decreased range of motion, tenderness, deformity and laceration.     Comments: L middle finger with jagged laceration to the distal phalanx at the DIP joint with exposed bone, appears to be likely fractured with deformity at the DIP joint region, limited ROM at DIP and PIP likely due to pain, bleeding controlled; L ring finger with jagged lac to distal fingertip through the nailplate  and extending approximately half way through the fingertip, appears to be partially amputated, limited ROM of DIP and PIP likely due to pain, bleeding also controlled. No retained FBs noted but inadequate visualization due to pt being in pain. Adequate ROM of the MCP joints in all fingers. SEE PICTURES BELOW  Skin:    General: Skin is warm and dry.     Findings: No rash.  Neurological:     Mental Status: He is alert and oriented to person, place, and time.     Sensory: Sensation is intact. No sensory deficit.     Motor: Motor function is intact.  Psychiatric:        Mood and Affect: Mood and affect normal.        Behavior: Behavior normal.          ED Treatments / Results  Labs (all labs ordered are listed, but only abnormal results are displayed) Labs Reviewed - No data to display  EKG None  Radiology Dg Hand Complete Left  Result Date: 05/18/2019 CLINICAL DATA:  Injury with table saw EXAM: LEFT HAND - COMPLETE 3+ VIEW COMPARISON:  None. FINDINGS: Frontal, oblique, and lateral views were obtained. There is soft tissue injury involving the distal third and fourth digits. There is a comminuted fracture of the distal aspect of the fourth distal phalanx with several displaced fracture fragments. There is dislocation at the third DIP joint with volar displacement of the distal third phalanx with respect to the third middle phalanx. There is a fracture along the dorsal proximal aspect of the third distal phalanx with several small fracture fragments in this area. No other fractures or dislocations evident. Joint spaces appear normal. No erosive change. IMPRESSION: 1. Comminuted fracture distal aspect fourth distal phalanx with displaced fracture fragments. 2. Dislocation third DIP joint with fracture along the proximal dorsal aspect of the third distal phalanx. 3. Soft tissue injury to the third and fourth distal digits with overlying bandage. Electronically Signed   By: Bretta Bang  III M.D.   On: 05/18/2019 18:20    Procedures Procedures (including critical care time)  Medications Ordered in ED Medications  morphine 4 MG/ML injection 4 mg (4 mg Intravenous Given 05/18/19 1732)  HYDROmorphone (DILAUDID) injection 1 mg (1 mg Intravenous Given 05/18/19 1807)     Initial Impression / Assessment and Plan / ED Course  I have reviewed the triage vital signs and the nursing notes.  Pertinent labs & imaging results that were available during my care of the patient were reviewed by me and considered in my medical decision making (see chart for details).  58 y.o. male here with L ring and middle finger lacerations sustained PTA. Last Tdap 7593yrs ago. On exam, L middle finger with jagged laceration to the distal phalanx at the DIP joint with exposed bone, appears to be likely fractured, limited ROM at DIP and PIP likely due to pain, bleeding controlled; L ring finger with jagged lac to distal fingertip through the nailplate, appears to be partially amputated but can't get good visualization initially due to pain, limited ROM of DIP and PIP likely due to pain, bleeding also controlled. No retained FBs noted but inadequate visualization due to pt being in pain. Will give pain meds and try to further assess the wounds. Will get xray. Likely will need surgical repair of the fingers. Will reassess shortly.   6:28 PM Xray L hand showing comminuted fx of distal aspect of 4th distal phalanx with displaced fx fragments, dislocation third DIP joint with fx along proximal dorsal aspect of 3rd distal phalanx, and soft tissue injuries with overlying bandage. Will consult hand surgeon. Able to assess further, limited ROM still present at DIP and PIP joints after morphine but suspect this is still somewhat related to pain; able to assess the lac to ring finger which extends about 50% through the fingertip. Will consult hand for repair.   7:03 PM Dr. Izora Ribasoley returning page and will proceed  with operative repair of the fingers. Holding orders to be placed by admitting team. Please see their notes for further documentation of care. I appreciate their help with this pleasant pt's care. Pt stable at time of admission.    Final Clinical Impressions(s) / ED Diagnoses   Final diagnoses:  Displaced fracture of distal phalanx of left middle finger, initial encounter for open fracture  Dislocation of distal interphalangeal (DIP) joint of left middle finger, initial encounter  Open displaced fracture of distal phalanx of left ring finger, initial encounter    ED Discharge Orders    [address]None       Braley Luckenbaugh, [place] LandingMercedes, [place]PA-C 05/18/19 1903    Mancel BaleWentz, Elliott, MD 05/19/19 [phone]

## 2019-05-18 NOTE — ED Triage Notes (Signed)
Pt arrives [facility] with hand injury. Pt was using a table saw and and it kicked and his hand when under saw. Left ring finger ems says the end of the finger was hanging. Left middle finger has a laceration.  Pt received 200 mcg of fentanyl last at 1615.

## 2019-05-18 NOTE — Discharge Instructions (Signed)
Discharge Instructions:  Keep your dressing clean, dry and in place until instructed to remove by Dr. Damin Salido.  If the dressing becomes dirty or wet call the office for instructions during business hours. Elevate the extremity to help with swelling, this will also help with any discomfort. Take your medication as prescribed. No lifting with the injured  extremity. If you feel that the dressing is too tight, you may loosen it, but keep it on; finger tips should be pink; if there is a concern, call the office. [phone] Ice may be used if the injury is a fracture, do not apply ice directly to the skin. Please call the office on the next business day after discharge to arrange a follow up appointment.  Call [phone] between the hours of 9am - 5pm M-Th or 9am - 1pm on Fri. For most hand injuries and/or conditions, you may return to work using the uninjured hand (one handed duty) within 24-72 hours.  A detailed note will be provided to you at your follow up appointment or may contact the office prior to your follow up.    

## 2019-05-19 NOTE — Op Note (Signed)
NAMRigoberto Noel: Sommers, Perl MEDICAL RECORD ZO:[id number] ACCOUNT [id number] DATE OF BIRTH:March 11, 1961 FACILITY: [facility] LOCATION: [facility] PHYSICIAN:Zollie Clemence C. Kreed Kauffman, MD  OPERATIVE REPORT  DATE OF PROCEDURE:  05/18/2019  PREOPERATIVE DIAGNOSIS:  Complex lacerations, open fractures near amputation of the left long finger and left ring finger.  POSTOPERATIVE DIAGNOSIS:  Complex lacerations, open fractures near amputation of the left long finger and left ring finger.  PROCEDURE:   1.  Exploration of complex wounds of both the left long and ring fingers.   2.  Debridement of full thickness skin and subcutaneous tissue, bone and nail bed of both the long and ring fingers.   3.  DIP joint washout of the left long finger. 4.  Repair of distal insertion of the extensor tendon of the left long finger.   5.  Percutaneous pinning of the unstable DIP joint of the left long finger with 0.45 K-wire. 6.  Repair of complex laceration of the left long finger measuring 2.5 cm. 7.  Debridement of full thickness skin, subcutaneous tissue, nail bed, bone of the left ring finger.  8.  Reduction of distal phalanx fracture, left ring finger.   9.  Repair of nail bed after removal of nail plate of the left ring finger 10.  Repair complex laceration measuring 1.5 cm.  INDICATIONS:  The patient is a 58 year old gentleman who was operating a table saw this afternoon and his finger slipped into the blade sustaining complex lacerations and near amputations of both these fingers as described above.  Presented to the ER and  I was consulted.  Risks, benefits and alternatives of repair were discussed with the patient.  He agreed with the risks and consent was obtained.  DESCRIPTION OF PROCEDURE:  The patient was taken to the operating room and placed supine on the operating room table.  Anesthesia was administered without difficulty.  A timeout was performed.  Preoperative antibiotics were given.  The left upper  extremity  was prepped and draped in normal sterile fashion.  A tourniquet was used on the upper arm.  The arm was exsanguinated.  The tourniquet was inflated to 250 mmHg.  Initially, both wounds were irrigated thoroughly with irrigation solution and  wounds were assessed.  Beginning with the long finger, full thickness skin, subcutaneous tissue, bone and extensor tendon were sharply debrided with a knife and scissors to create a nice skin edge and remove any nonviable tissue.  The DIP joint was  exposed and thoroughly irrigated.  The extensor tendon was transected at the level of the DIP joint.  There was some bone loss both on the head of the middle phalanx as well as the distal phalanx joints dorsally.  The DIP joint was dislocated.  After  adequate irrigation and debridement, the frayed tissues of what was left of the extensor tendon were approximated as well as the skin with multiple interrupted 5-0 nylon sutures.  This held the fingertip somewhat stable.  X-ray was brought into view.   The distal phalanx was further reduced and a 0.045 K wire was  driven in a retrograde fashion from the distal phalanx across the end of the head of the middle phalanx, nicely relocating and providing stability to the joint.  The complex laceration was  gently rotated some to cover the defect where there was missing tissue.  This was able to be mostly covered.  Next, the ring finger was addressed.  There was a laceration through the base of the nail with laceration and fracture of the  distal phalanx  with some distal phalanx bone loss as well as the nail and soft tissue loss.  After irrigation, the skin and subcutaneous tissue, nail bed and bone were all debrided sharply with a knife and scissors.  The bony fragments were approximated while the  laceration on each side of the nail bed was approximated with interrupted 5-0 nylon sutures.  The remnants of the nail plate were removed.  The nail bed was then repaired with multiple  interrupted 6-0 chromic sutures.  Afterwards, the tourniquet was  released.  There was a return of pink color to both the long and ring fingertips.  Marcaine was instilled proximally for postoperative pain control.  Sterile dressings were applied.    The patient tolerated the procedure well and was taken to the operating room to the recovery room stable.  AN/NUANCE  D:05/18/2019 T:05/19/2019 JOB:[id number]

## 2019-05-20 NOTE — Anesthesia Postprocedure Evaluation (Signed)
Anesthesia Post Note  Patient: Brett Burns  Procedure(s) Performed: REPAIR COMPLES LACERATION 3RD AND 4TH DIGIT (Left )     Patient location during evaluation: PACU Anesthesia Type: General Level of consciousness: awake and alert Pain management: pain level controlled Vital Signs Assessment: post-procedure vital signs reviewed and stable Respiratory status: spontaneous breathing, nonlabored ventilation, respiratory function stable and patient connected to nasal cannula oxygen Cardiovascular status: blood pressure returned to baseline and stable Postop Assessment: no apparent nausea or vomiting Anesthetic complications: no    Last Vitals:  Vitals:   05/18/19 2333 05/18/19 2345  BP:  (!) 143/82  Pulse: 88 83  Resp: 14 10  Temp:  (!) 36.4 C  SpO2: 92% 98%    Last Pain:  Vitals:   05/18/19 2345  TempSrc:   PainSc: 4                  Khloee Garza

## 2019-05-21 ENCOUNTER — Encounter (HOSPITAL_COMMUNITY): Payer: Self-pay | Admitting: General Surgery

## 2020-08-15 IMAGING — CR LEFT HAND - COMPLETE 3+ VIEW
3 series · 3 of 3 positions shown · non-contrast
Comparison: None.

CLINICAL DATA: Injury with table saw

EXAM:
LEFT HAND - COMPLETE 3+ VIEW

[hand pa]
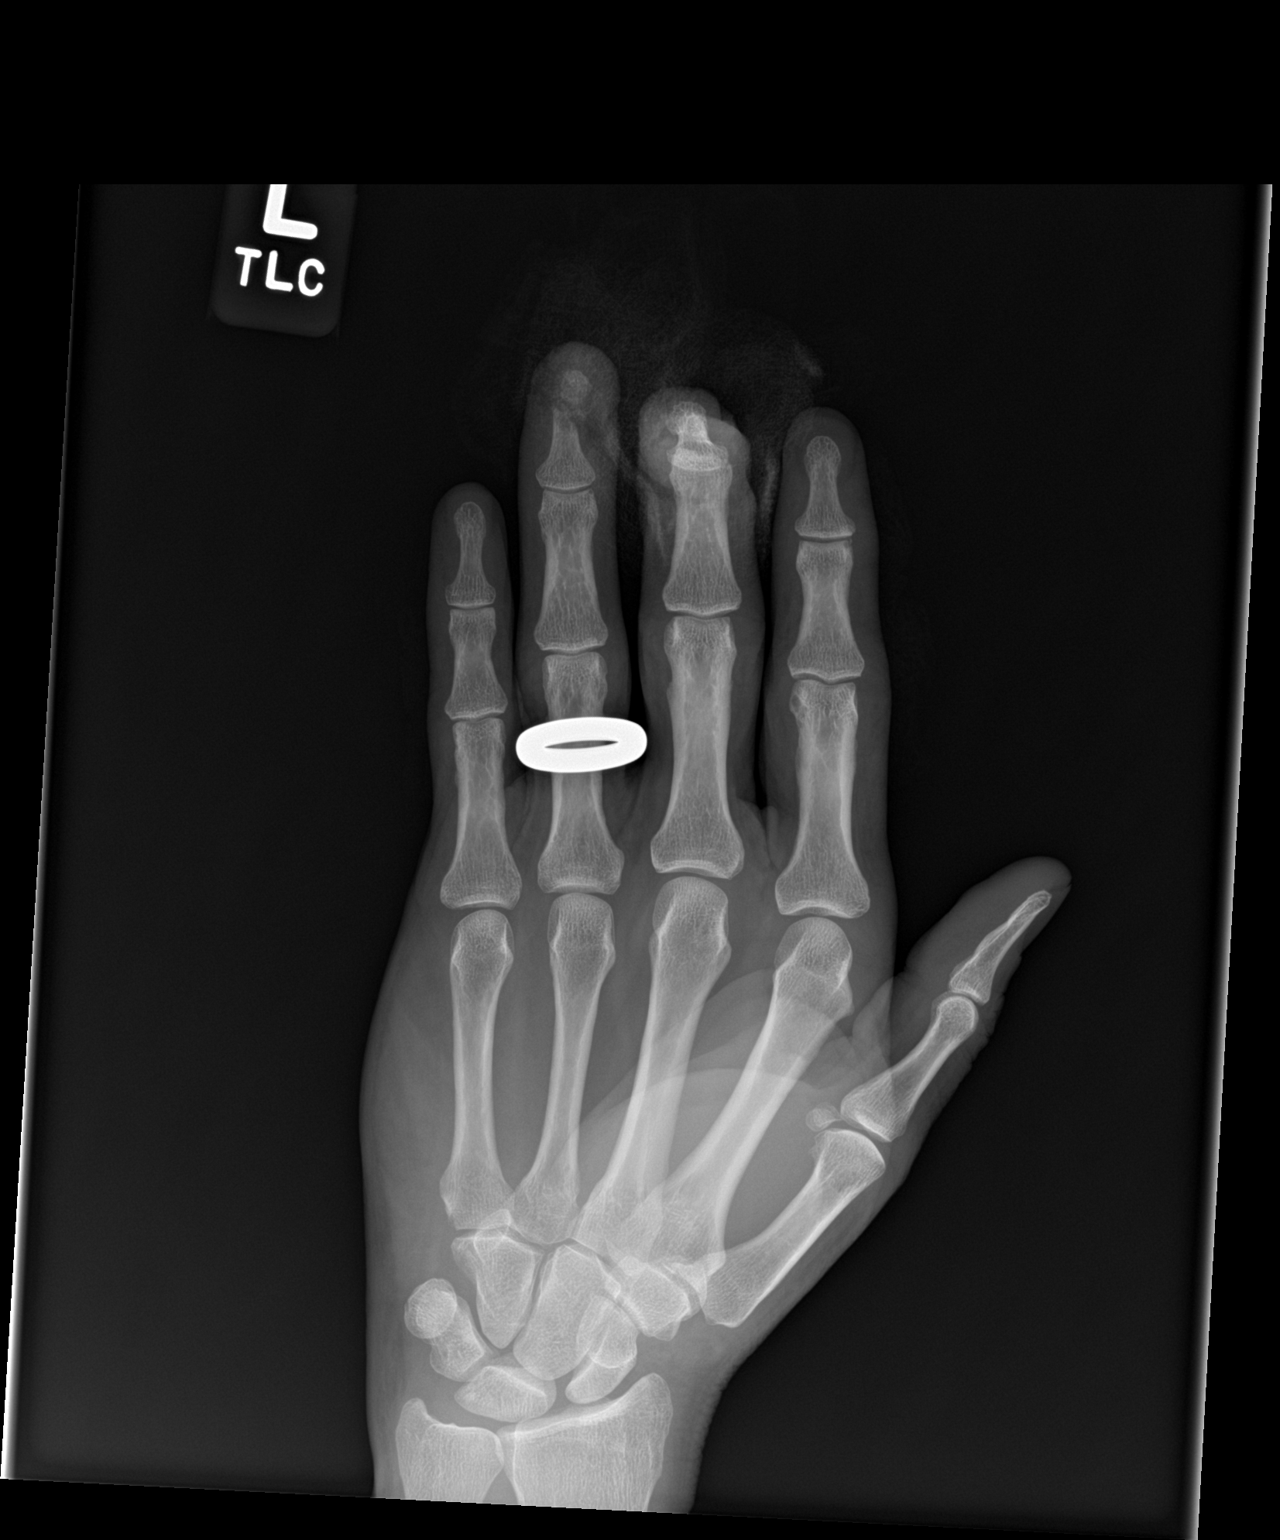

[hand obl]
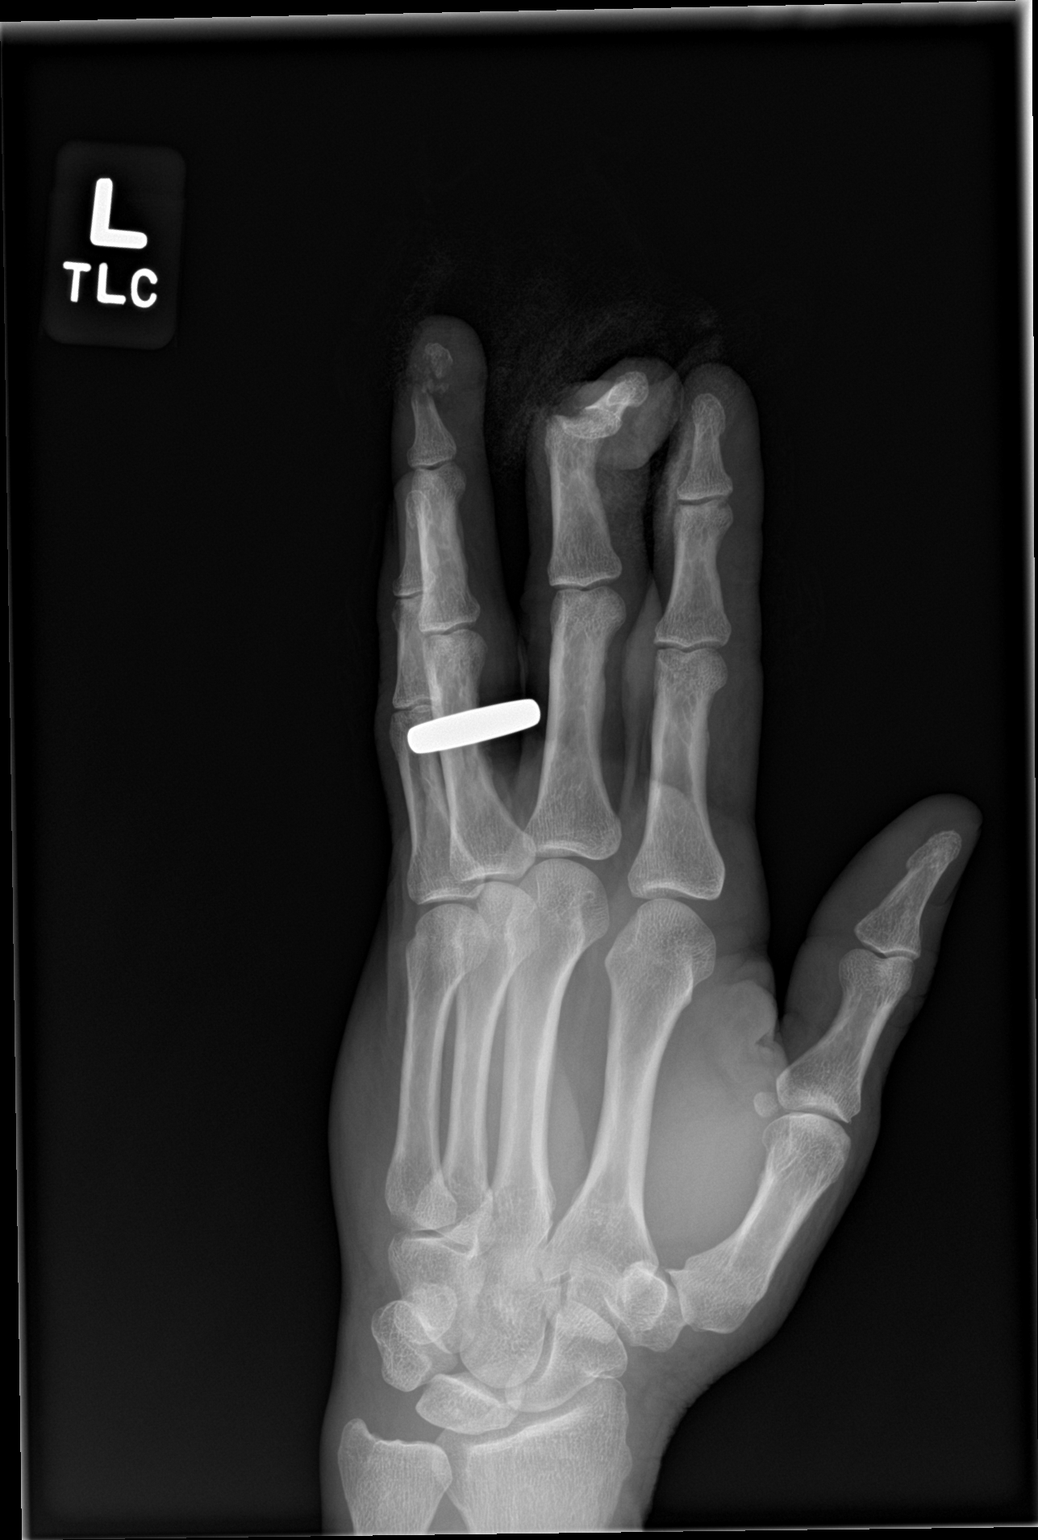

[hand lat]
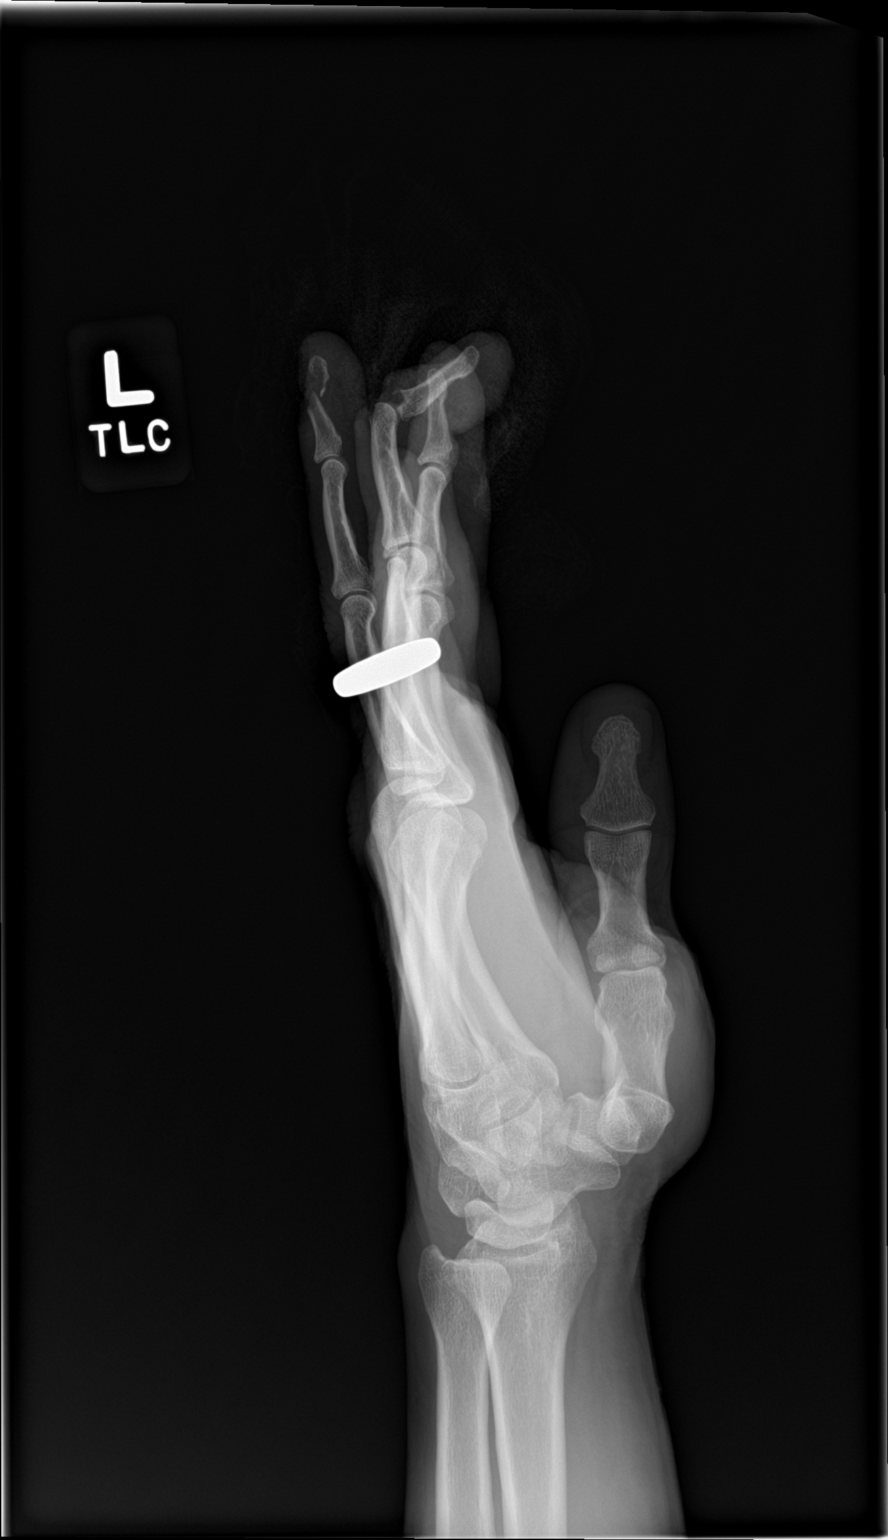

[3 of 3 positions shown; findings below may reference images not displayed]

FINDINGS: Frontal, oblique, and lateral views were obtained. There is soft
tissue injury involving the distal third and fourth digits. There is
a comminuted fracture of the distal aspect of the fourth distal
phalanx with several displaced fracture fragments. There is
dislocation at the third DIP joint with volar displacement of the
distal third phalanx with respect to the third middle phalanx. There
is a fracture along the dorsal proximal aspect of the third distal
phalanx with several small fracture fragments in this area.

No other fractures or dislocations evident. Joint spaces appear
normal. No erosive change.
IMPRESSION: 1. Comminuted fracture distal aspect fourth distal phalanx with
displaced fracture fragments.

2. Dislocation third DIP joint with fracture along the proximal
dorsal aspect of the third distal phalanx.

3. Soft tissue injury to the third and fourth distal digits with
overlying bandage.
